# Patient Record
Sex: Male | Born: 1960 | Race: White | Hispanic: No | Marital: Single | Smoking: Current every day smoker
Health system: Southern US, Community
[De-identification: ages and names within clinical notes are randomized; demographics above are authoritative.]

## PROBLEM LIST (undated history)

## (undated) DIAGNOSIS — I10 Essential (primary) hypertension: Secondary | ICD-10-CM

## (undated) DIAGNOSIS — G4733 Obstructive sleep apnea (adult) (pediatric): Secondary | ICD-10-CM

## (undated) HISTORY — DX: Essential (primary) hypertension: I10

## (undated) HISTORY — DX: Obstructive sleep apnea (adult) (pediatric): G47.33

## (undated) HISTORY — PX: OTHER SURGICAL HISTORY: SHX169

---

## 2003-09-21 ENCOUNTER — Ambulatory Visit (HOSPITAL_COMMUNITY): Admission: RE | Admit: 2003-09-21 | Discharge: 2003-09-21 | Payer: Self-pay | Admitting: General Surgery

## 2003-11-16 ENCOUNTER — Inpatient Hospital Stay (HOSPITAL_COMMUNITY): Admission: EM | Admit: 2003-11-16 | Discharge: 2003-11-20 | Payer: Self-pay | Admitting: Emergency Medicine

## 2004-08-21 ENCOUNTER — Ambulatory Visit (HOSPITAL_COMMUNITY): Admission: RE | Admit: 2004-08-21 | Discharge: 2004-08-21 | Payer: Self-pay | Admitting: Endocrinology

## 2005-02-03 ENCOUNTER — Ambulatory Visit (HOSPITAL_COMMUNITY): Admission: RE | Admit: 2005-02-03 | Discharge: 2005-02-03 | Payer: Self-pay | Admitting: Orthopedic Surgery

## 2011-08-01 ENCOUNTER — Ambulatory Visit (INDEPENDENT_AMBULATORY_CARE_PROVIDER_SITE_OTHER): Payer: Managed Care, Other (non HMO) | Admitting: Internal Medicine

## 2011-08-01 VITALS — BP 153/83 | HR 78 | Temp 97.5°F | Resp 18 | Ht 70.0 in | Wt 335.0 lb

## 2011-08-01 DIAGNOSIS — F0781 Postconcussional syndrome: Secondary | ICD-10-CM

## 2011-08-01 DIAGNOSIS — Z6841 Body Mass Index (BMI) 40.0 and over, adult: Secondary | ICD-10-CM | POA: Insufficient documentation

## 2011-08-01 DIAGNOSIS — S0990XA Unspecified injury of head, initial encounter: Secondary | ICD-10-CM

## 2011-08-01 DIAGNOSIS — I10 Essential (primary) hypertension: Secondary | ICD-10-CM | POA: Insufficient documentation

## 2011-08-01 DIAGNOSIS — S0180XA Unspecified open wound of other part of head, initial encounter: Secondary | ICD-10-CM

## 2011-08-01 DIAGNOSIS — IMO0002 Reserved for concepts with insufficient information to code with codable children: Secondary | ICD-10-CM

## 2011-08-01 MED ORDER — TRAMADOL HCL 50 MG PO TABS: 50.0000 mg | ORAL_TABLET | Freq: Three times a day (TID) | ORAL | Status: AC | PRN

## 2011-08-01 MED ORDER — AMOXICILLIN-POT CLAVULANATE 875-125 MG PO TABS: ORAL_TABLET | ORAL | Status: DC

## 2011-08-01 MED ORDER — TETANUS-DIPHTH-ACELL PERTUSSIS 5-2.5-18.5 LF-MCG/0.5 IM SUSP
0.5000 mL | Freq: Once | INTRAMUSCULAR | Status: AC
  Administered 2011-08-01: 0.5 mL via INTRAMUSCULAR

## 2011-08-01 NOTE — Progress Notes (Signed)
  Subjective:    Patient ID: Colin Lam, male    DOB: Sep 13, 1960, 51 y.o.   MRN: [id number]  HPISlipped trying to get in and out of the rain and fell and hit doorframe R frontal wound 8pm last night No loss of consciousness Able to stabilize bleeding with pressure Mild headache No dizziness, no gait abnormality, no confusion, no sensory changes Mild decrease this morning in appetite  Last tetanus 7-8 years ago at least    Review of SystemsHypertension/on medications/cardiovascular review of systems negative otherwise     Objective:   Physical Exam  HENT:  Head: Head is with laceration. Head is without raccoon's eyes, without Battle's sign and without left periorbital erythema.     Blood pressure 153/83 Alert/in no acute distress/Oriented to time person and place Pupils equal round reactive to light and accommodation extra ocular movements conjugate Cranial nerves 2 through 12 intact        .umf Assessment & Plan:  Problem #1 head injury without loss of consciousness Problem #2 scalp laceration  Head injury precautions discussed Tdap Wound care Recheck for suture removal or sooner if worse Augmentin 875 twice a day for 10 days Tramadol if needed Recheck one week sooner if any signs of problems

## 2011-08-01 NOTE — Patient Instructions (Signed)
Wound Care Wound care helps prevent pain and infection.  You may need a tetanus shot if:  You cannot remember when you had your last tetanus shot.   You have never had a tetanus shot.   The injury broke your skin.  If you need a tetanus shot and you choose not to have one, you may get tetanus. Sickness from tetanus can be serious. HOME CARE   Only take medicine as told by your doctor.   Clean the wound daily with mild soap and water.   Change any bandages (dressings) as told by your doctor.   Put medicated cream and a bandage on the wound as told by your doctor.   Change the bandage if it gets wet, dirty, or starts to smell.   Take showers. Do not take baths, swim, or do anything that puts your wound under water.   Rest and raise (elevate) the wound until the pain and puffiness (swelling) are better.   Keep all doctor visits as told.  GET HELP RIGHT AWAY IF:   Yellowish-white fluid (pus) comes from the wound.   Medicine does not lessen your pain.   There is a red streak going away from the wound.   You cannot move your finger or toe.   You have a fever.  MAKE SURE YOU:   Understand these instructions.   Will watch your condition.   Will get help right away if you are not doing well or get worse.  Document Released: 11/19/2007 Document Revised: 01/29/2011 Document Reviewed: 06/15/2010 [facility] Patient Information 2012 [place], [place].Head Injury, Adult You have had a head injury that does not appear serious at this time. A concussion is a state of changed mental ability, usually from a blow to the head. You should take clear liquids for the rest of the day and then resume your regular diet. You should not take sedatives or alcoholic beverages for as long as directed by your caregiver after discharge. After injuries such as yours, most problems occur within the first 24 hours. SYMPTOMS These minor symptoms may be experienced after discharge:  Memory difficulties.    Dizziness.   Headaches.   Double vision.   Hearing difficulties.   Depression.   Tiredness.   Weakness.   Difficulty with concentration.  If you experience any of these problems, you should not be alarmed. A concussion requires a few days for recovery. Many patients with head injuries frequently experience such symptoms. Usually, these problems disappear without medical care. If symptoms last for more than one day, notify your caregiver. See your caregiver sooner if symptoms are becoming worse rather than better. HOME CARE INSTRUCTIONS   During the next 24 hours you must stay with someone who can watch you for the warning signs listed below.  Although it is unlikely that serious side effects will occur, you should be aware of signs and symptoms which may necessitate your return to this location. Side effects may occur up to 7 - 10 days following the injury. It is important for you to carefully monitor your condition and contact your caregiver or seek immediate medical attention if there is a change in your condition. SEEK IMMEDIATE MEDICAL CARE IF:   There is confusion or drowsiness.   You can not awaken the injured person.   There is nausea (feeling sick to your stomach) or continued, forceful vomiting.   You notice dizziness or unsteadiness which is getting worse, or inability to walk.   You have convulsions or  unconsciousness.   You experience severe, persistent headaches not relieved by over-the-counter or prescription medicines for pain. (Do not take aspirin as this impairs clotting abilities). Take other pain medications only as directed.   You can not use arms or legs normally.   There is clear or bloody discharge from the nose or ears.  MAKE SURE YOU:   Understand these instructions.   Will watch your condition.   Will get help right away if you are not doing well or get worse.  Document Released: 02/09/2005 Document Revised: 01/29/2011 Document Reviewed:  12/28/2008 [facility] Patient Information 2012 [place], [place].

## 2011-08-01 NOTE — Progress Notes (Signed)
   Patient ID: KAHLEL PEAKE MRN: [id number], DOB: [birth year], 51 y.o. Date of Encounter: 08/01/2011, 2:11 PM   PROCEDURE NOTE: Verbal consent obtained. Sterile technique employed. Numbing: Anesthesia obtained with 7cc 2% lidocaine with epi.  Cleansed with soap and water. Irrigated. Betadine prep per usual protocol.  Wound explored by myself and Dr. Merla Riches, revealing deep wound flap, and small galea defect.  Wound repaired with # 1 deep SI to repair small galea defect 5-0 Vicryl, #6 SI subcutaneous sutures 5-0 Vicryl, and #21 SI 5-0 Prolene. Hemostasis obtained. Wound cleansed and dressed.  Wound care instructions including precautions covered with patient. Handout given.  Anticipate suture removal in 7 days.  Signed, Eula Listen, PA-C 08/01/2011 2:11 PM

## 2011-08-08 ENCOUNTER — Ambulatory Visit (INDEPENDENT_AMBULATORY_CARE_PROVIDER_SITE_OTHER): Payer: Managed Care, Other (non HMO) | Admitting: Internal Medicine

## 2011-08-08 DIAGNOSIS — S0100XA Unspecified open wound of scalp, initial encounter: Secondary | ICD-10-CM

## 2011-08-09 NOTE — Progress Notes (Signed)
Followup for a recheck and suture removal after head injury He has done extremely well with no headaches or problems with work since his injury  Exam-the wound is well healed and the suture line shows no evidence of infection After removal of sutures there is no gapping or problems with healing  Impression #1 scalp laceration with head injury Both resolved/4 activity as tolerated

## 2014-07-06 ENCOUNTER — Encounter: Payer: Self-pay | Admitting: Endocrinology

## 2015-04-29 ENCOUNTER — Encounter: Payer: Self-pay | Admitting: Gastroenterology

## 2015-06-03 ENCOUNTER — Ambulatory Visit (AMBULATORY_SURGERY_CENTER): Payer: Self-pay

## 2015-06-03 VITALS — Ht 71.0 in | Wt 347.0 lb

## 2015-06-03 DIAGNOSIS — Z1211 Encounter for screening for malignant neoplasm of colon: Secondary | ICD-10-CM

## 2015-06-03 MED ORDER — SUPREP BOWEL PREP KIT 17.5-3.13-1.6 GM/177ML PO SOLN: 1.0000 | Freq: Once | ORAL | Status: DC

## 2015-06-03 NOTE — Progress Notes (Signed)
No allergies to eggs or soy No diet meds No home oxygen No past problems with anesthesia  No internet 

## 2015-06-17 ENCOUNTER — Ambulatory Visit (AMBULATORY_SURGERY_CENTER): Payer: 59 | Admitting: Gastroenterology

## 2015-06-17 ENCOUNTER — Encounter: Payer: Self-pay | Admitting: Gastroenterology

## 2015-06-17 VITALS — BP 129/84 | HR 62 | Temp 99.2°F | Resp 10 | Ht 71.0 in | Wt 347.0 lb

## 2015-06-17 DIAGNOSIS — D123 Benign neoplasm of transverse colon: Secondary | ICD-10-CM | POA: Diagnosis not present

## 2015-06-17 DIAGNOSIS — D122 Benign neoplasm of ascending colon: Secondary | ICD-10-CM

## 2015-06-17 DIAGNOSIS — Z1211 Encounter for screening for malignant neoplasm of colon: Secondary | ICD-10-CM

## 2015-06-17 MED ORDER — SODIUM CHLORIDE 0.9 % IV SOLN: 500.0000 mL | INTRAVENOUS | Status: DC

## 2015-06-17 NOTE — Patient Instructions (Signed)
YOU HAD AN ENDOSCOPIC PROCEDURE TODAY AT [facility]:   Refer to the procedure report that was given to you for any specific questions about what was found during the examination.  If the procedure report does not answer your questions, please call your gastroenterologist to clarify.  If you requested that your care partner not be given the details of your procedure findings, then the procedure report has been included in a sealed envelope for you to review at your convenience later.  YOU SHOULD EXPECT: Some feelings of bloating in the abdomen. Passage of more gas than usual.  Walking can help get rid of the air that was put into your GI tract during the procedure and reduce the bloating. If you had a lower endoscopy (such as a colonoscopy or flexible sigmoidoscopy) you may notice spotting of blood in your stool or on the toilet paper. If you underwent a bowel prep for your procedure, you may not have a normal bowel movement for a few days.  Please Note:  You might notice some irritation and congestion in your nose or some drainage.  This is from the oxygen used during your procedure.  There is no need for concern and it should clear up in a day or so.  SYMPTOMS TO REPORT IMMEDIATELY:   Following lower endoscopy (colonoscopy or flexible sigmoidoscopy):  Excessive amounts of blood in the stool  Significant tenderness or worsening of abdominal pains  Swelling of the abdomen that is new, acute  Fever of 100F or higher   For urgent or emergent issues, a gastroenterologist can be reached at any hour by calling [phone].   DIET: Your first meal following the procedure should be a small meal and then it is ok to progress to your normal diet. Heavy or fried foods are harder to digest and may make you feel nauseous or bloated.  Likewise, meals heavy in dairy and vegetables can increase bloating.  Drink plenty of fluids but you should avoid alcoholic beverages for 24  hours.  ACTIVITY:  You should plan to take it easy for the rest of today and you should NOT DRIVE or use heavy machinery until tomorrow (because of the sedation medicines used during the test).    FOLLOW UP: Our staff will call the number listed on your records the next business day following your procedure to check on you and address any questions or concerns that you may have regarding the information given to you following your procedure. If we do not reach you, we will leave a message.  However, if you are feeling well and you are not experiencing any problems, there is no need to return our call.  We will assume that you have returned to your regular daily activities without incident.  If any biopsies were taken you will be contacted by phone or by letter within the next 1-3 weeks.  Please call us at [phone] if you have not heard about the biopsies in 3 weeks.    SIGNATURES/CONFIDENTIALITY: You and/or your care partner have signed paperwork which will be entered into your electronic medical record.  These signatures attest to the fact that that the information above on your After Visit Summary has been reviewed and is understood.  Full responsibility of the confidentiality of this discharge information lies with you and/or your care-partner.  Read all of the handouts given to you by your recovery room nurse.  Thank-you for choosing [place] for your healthcare needs today.

## 2015-06-17 NOTE — Op Note (Signed)
[name] Procedure Date: 06/17/2015 1:52 PM MRN: [id number] Endoscopist: Ladene Artist , MD Age: 55 Date of Birth: 1960-12-12 Gender: Male Procedure:                Colonoscopy Indications:              Screening for colorectal malignant neoplasm Medicines:                Monitored Anesthesia Care Procedure:                Pre-Anesthesia Assessment:                           - Prior to the procedure, a History and Physical                            was performed, and patient medications and                            allergies were reviewed. The patient's tolerance of                            previous anesthesia was also reviewed. The risks                            and benefits of the procedure and the sedation                            options and risks were discussed with the patient.                            All questions were answered, and informed consent                            was obtained. Prior Anticoagulants: The patient has                            taken no previous anticoagulant or antiplatelet                            agents. ASA Grade Assessment: II - A patient with                            mild systemic disease. After reviewing the risks                            and benefits, the patient was deemed in                            satisfactory condition to undergo the procedure.                           After obtaining informed consent, the colonoscope  was passed under direct vision. Throughout the                            procedure, the patient's blood pressure, pulse, and                            oxygen saturations were monitored continuously. The                            Model PCF-H190L [id number]) scope was introduced                            through the anus and advanced to the the cecum,                            identified by appendiceal orifice and ileocecal                    valve. The colonoscopy was performed without                            difficulty. The patient tolerated the procedure                            well. The quality of the bowel preparation was                            adequate. The ileocecal valve, appendiceal orifice,                            and rectum were photographed. Scope In: 2:02:17 PM Scope Out: 2:19:56 PM Scope Withdrawal Time: 0 hours 15 minutes 18 seconds  Total Procedure Duration: 0 hours 17 minutes 39 seconds  Findings:                 The digital rectal exam was normal.                           Three sessile polyps were found in the transverse                            colon (1) and ascending colon (2). The polyps were                            7 to 8 mm in size. These polyps were removed with a                            cold snare. Resection and retrieval were complete.                           Internal hemorrhoids were found during                            retroflexion. The hemorrhoids were small and Grade  I (internal hemorrhoids that do not prolapse).                           The exam was otherwise normal throughout the                            examined colon.                           No additional abnormalities were found on                            retroflexion. Complications:            No immediate complications. Estimated Blood Loss:     Estimated blood loss: none. Impression:               - Three 7 to 8 mm polyps in the transverse colon                            and in the ascending colon, removed with a cold                            snare. Resected and retrieved.                           - Internal hemorrhoids. Recommendation:           - Patient has a contact number available for                            emergencies. The signs and symptoms of potential                            delayed complications were discussed with the                             patient. Return to normal activities tomorrow.                            Written discharge instructions were provided to the                            patient.                           - Resume previous diet.                           - Continue present medications.                           - Await pathology results.                           - Repeat colonoscopy in 5 years for surveillance if  polyp(s) are precancerous, otherwise 10 years. Ladene Artist, MD 06/17/2015 2:23:50 PM This report has been signed electronically.

## 2015-06-17 NOTE — Progress Notes (Signed)
Report given to PACU RN, vss 

## 2015-06-17 NOTE — Progress Notes (Signed)
Called to room to assist during endoscopic procedure.  Patient ID and intended procedure confirmed with present staff. Received instructions for my participation in the procedure from the performing physician.  

## 2015-06-18 ENCOUNTER — Telehealth: Payer: Self-pay | Admitting: *Deleted

## 2015-06-18 NOTE — Telephone Encounter (Signed)
  Follow up Call-  Call back number 06/17/2015  Post procedure Call Back phone  # [phone]  Permission to leave phone message Yes     Patient questions:  Do you have a fever, pain , or abdominal swelling? No. Pain Score  0 *  Have you tolerated food without any problems? Yes.    Have you been able to return to your normal activities? Yes.    Do you have any questions about your discharge instructions: Diet   No. Medications  No. Follow up visit  No.  Do you have questions or concerns about your Care? No.  Actions: * If pain score is 4 or above: No action needed, pain <4.

## 2015-06-28 ENCOUNTER — Encounter: Payer: Self-pay | Admitting: Gastroenterology

## 2017-09-09 ENCOUNTER — Other Ambulatory Visit: Payer: Self-pay | Admitting: Rheumatology

## 2017-09-09 DIAGNOSIS — M25562 Pain in left knee: Secondary | ICD-10-CM

## 2017-09-12 ENCOUNTER — Ambulatory Visit
Admission: RE | Admit: 2017-09-12 | Discharge: 2017-09-12 | Disposition: A | Discharge status: Home / Self Care | Payer: 59 | Source: Ambulatory Visit | Attending: Rheumatology | Admitting: Rheumatology

## 2017-09-12 DIAGNOSIS — M25562 Pain in left knee: Secondary | ICD-10-CM

## 2019-12-20 IMAGING — MR MR KNEE*L* W/O CM
4 of 7 series · 23 of 40 positions shown · non-contrast
Comparison: Radiographs 06/24/2017

CLINICAL DATA: Knee pain and weakness for 3-4 months.

EXAM:
MRI OF THE LEFT KNEE WITHOUT CONTRAST
TECHNIQUE: Multiplanar, multisequence MR imaging of the knee was performed. No
intravenous contrast was administered.

[Series 5: T2 fat-sat · coronal · 4.0mm · 0.68mm/px · 6 of 33 slices shown (1 of 2)]
[im 1/33]
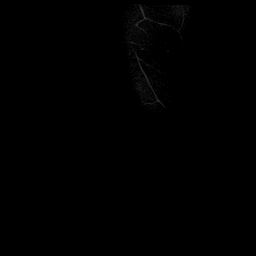
[im 7/33]
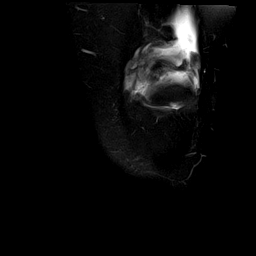
[im 13/33]
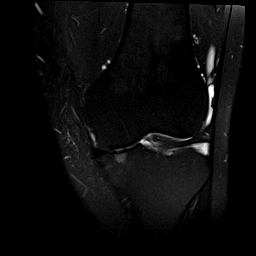
[im 20/33]
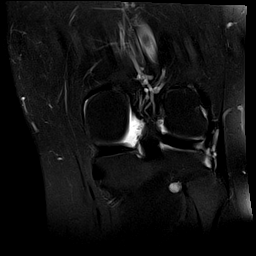
[im 26/33]
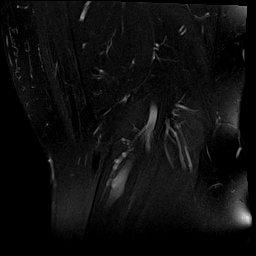
[im 33/33]
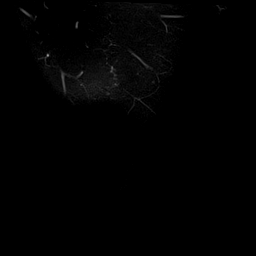

[Series 6: T1 · coronal · 4.0mm · 0.34mm/px · 5 of 33 slices shown]
[im 1/33]
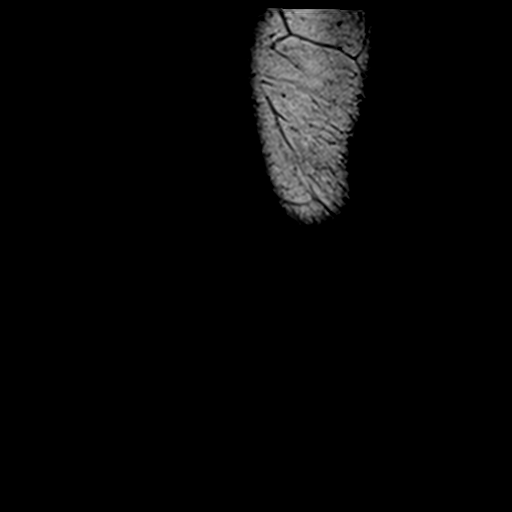
[im 7/33]
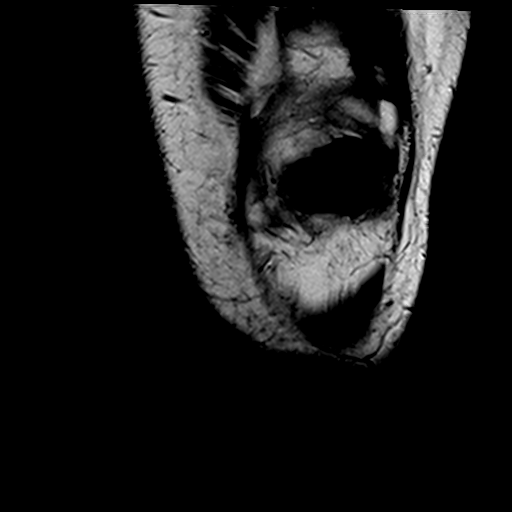
[im 13/33]
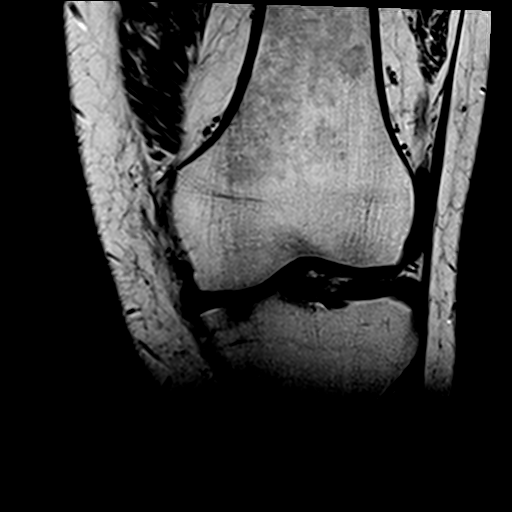
[im 20/33]
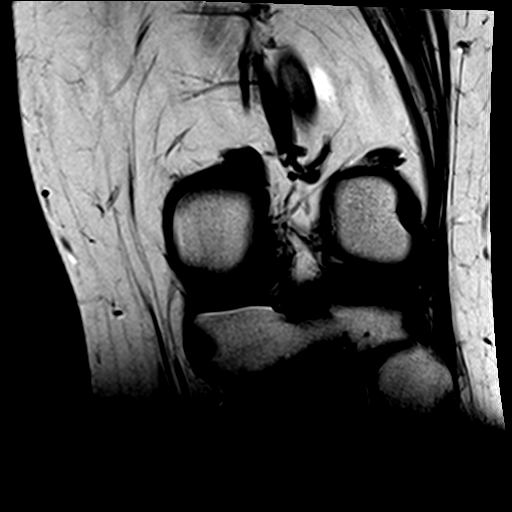
[im 33/33]
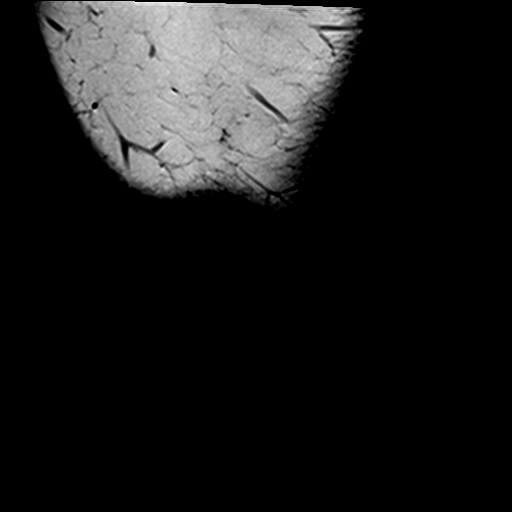

[Series 8: PD fat-sat · sagittal · 3.0mm · 0.34mm/px · 6 of 33 slices shown]
[im 1/33]
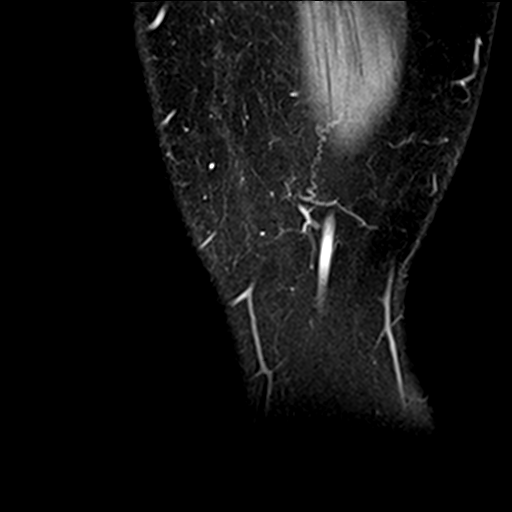
[im 7/33]
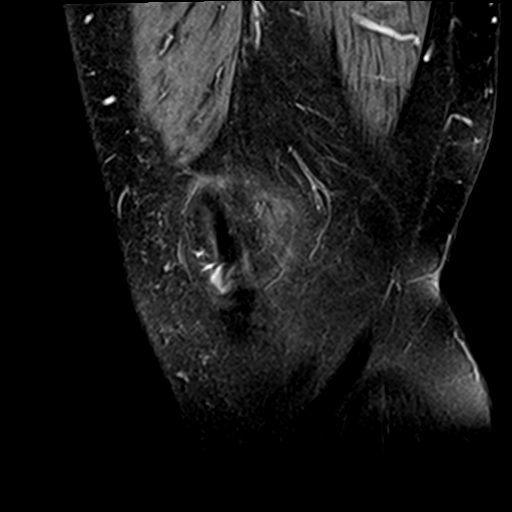
[im 13/33]
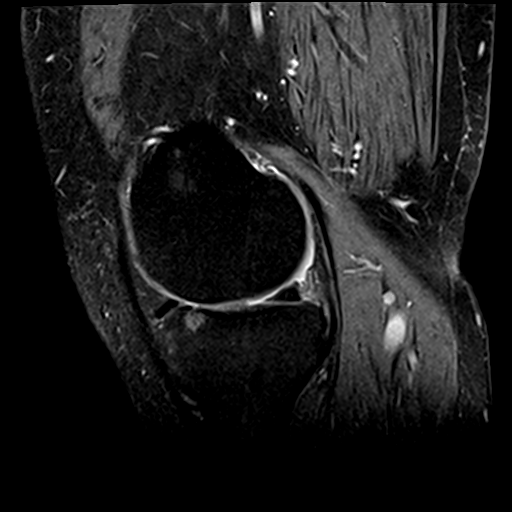
[im 20/33]
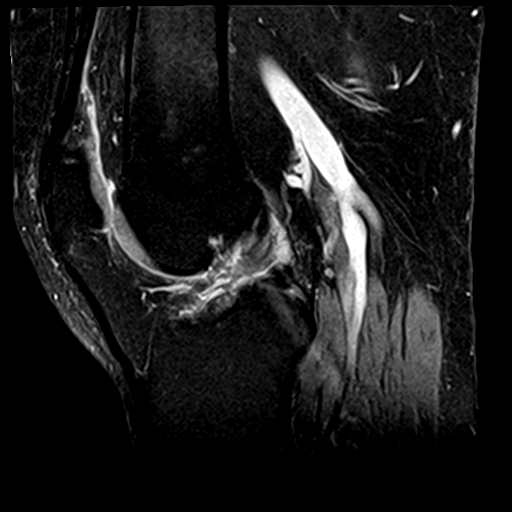
[im 26/33]
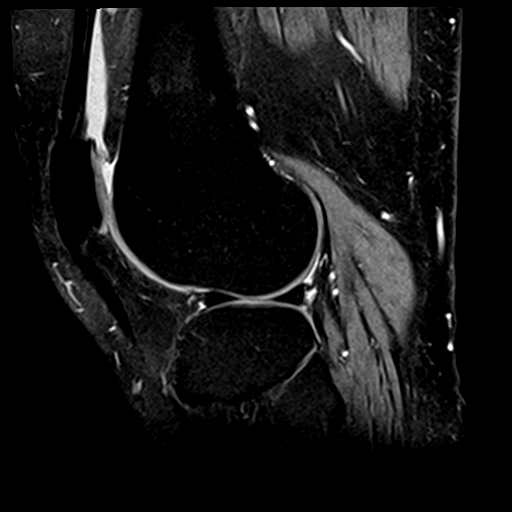
[im 33/33]
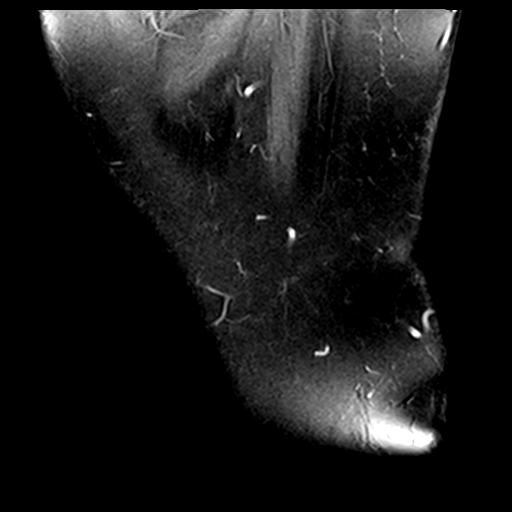

[Series 9: T2 fat-sat · sagittal · 3.0mm · 0.34mm/px · 6 of 33 slices shown (2 of 2)]
[im 1/33]
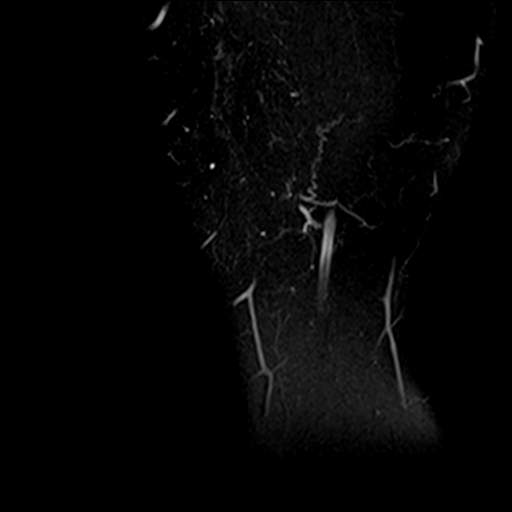
[im 7/33]
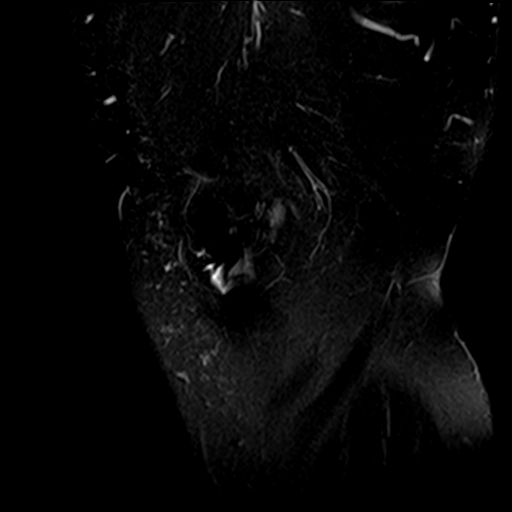
[im 13/33]
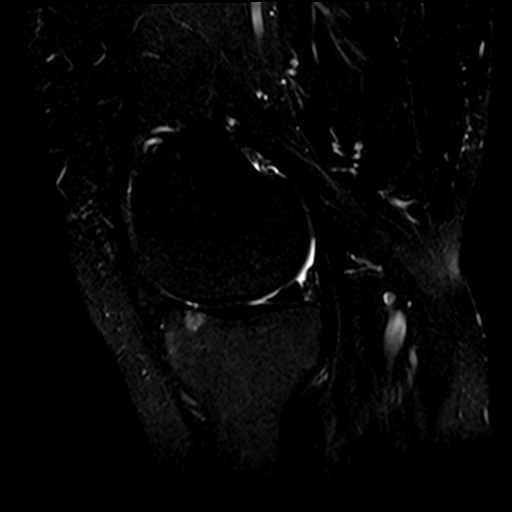
[im 20/33]
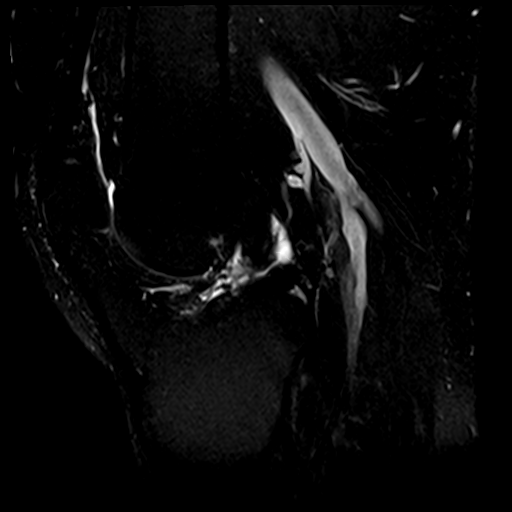
[im 26/33]
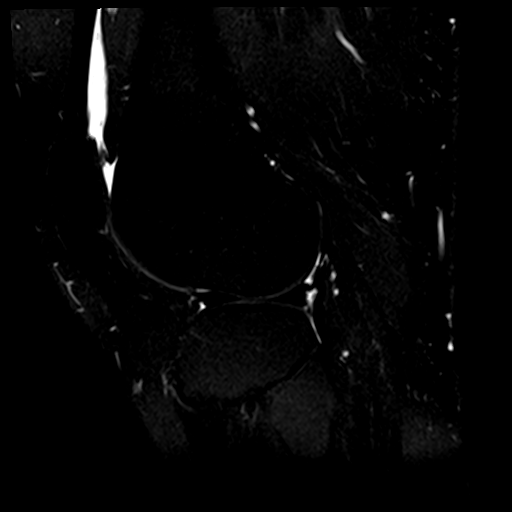
[im 33/33]
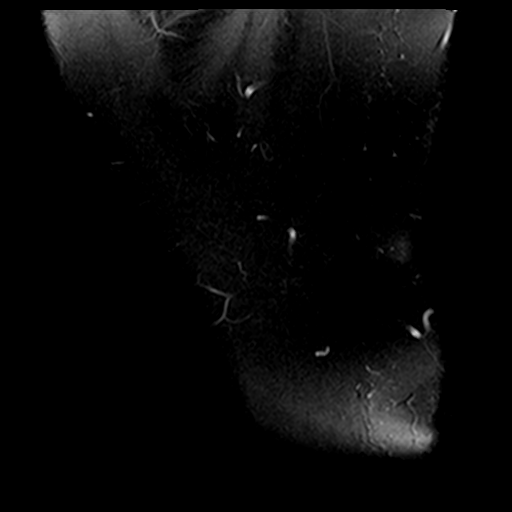

[23 of 40 positions shown; findings below may reference images not displayed]

FINDINGS: MENISCI

Medial meniscus:  Intact

Lateral meniscus:  Intact

LIGAMENTS

Cruciates:  Intact

Collaterals:  Intact

CARTILAGE

Patellofemoral: Focal chondromalacia noted at the patellar apex with
moderate cartilage thinning, fissuring and fraying and underlying
subchondral edema. Elongated and very flat lateral facet and very
shallow femoral trochlea.

Medial:  Mild degenerative chondrosis.

Lateral:  Mild degenerative chondrosis.

Joint:  Small joint effusion.

Popliteal Fossa:  No popliteal mass or Baker's cyst.

Extensor Mechanism: The patella retinacular structures are intact
and the quadriceps and patellar tendons are intact. There is mild
proximal and distal patellar tendinopathy.

Bones:  No acute bony findings.

Other: Normal knee musculature.
IMPRESSION: 1. Focal chondromalacia at the patellar apex.
2. Proximal and distal patellar tendinopathy.
3. Intact ligamentous structures and no acute bony findings.
4. No meniscal tears.
5. Small joint effusion.

## 2022-07-29 ENCOUNTER — Other Ambulatory Visit: Payer: Self-pay

## 2022-07-29 DIAGNOSIS — F17200 Nicotine dependence, unspecified, uncomplicated: Secondary | ICD-10-CM

## 2022-08-14 ENCOUNTER — Encounter: Payer: Self-pay | Admitting: Rheumatology

## 2022-08-31 ENCOUNTER — Ambulatory Visit
Admission: RE | Admit: 2022-08-31 | Discharge: 2022-08-31 | Disposition: A | Discharge status: Home / Self Care | Payer: 59 | Source: Ambulatory Visit

## 2022-08-31 DIAGNOSIS — F17200 Nicotine dependence, unspecified, uncomplicated: Secondary | ICD-10-CM

## 2023-01-17 ENCOUNTER — Other Ambulatory Visit: Payer: Self-pay

## 2023-01-17 ENCOUNTER — Emergency Department (HOSPITAL_COMMUNITY)
Admission: EM | Admit: 2023-01-17 | Discharge: 2023-01-17 | Disposition: A | Discharge status: Home / Self Care | Payer: 59 | Attending: Emergency Medicine | Admitting: Emergency Medicine

## 2023-01-17 ENCOUNTER — Encounter (HOSPITAL_COMMUNITY): Payer: Self-pay | Admitting: *Deleted

## 2023-01-17 ENCOUNTER — Emergency Department (HOSPITAL_COMMUNITY): Payer: 59

## 2023-01-17 DIAGNOSIS — I10 Essential (primary) hypertension: Secondary | ICD-10-CM

## 2023-01-17 DIAGNOSIS — G4700 Insomnia, unspecified: Secondary | ICD-10-CM | POA: Diagnosis not present

## 2023-01-17 DIAGNOSIS — R110 Nausea: Secondary | ICD-10-CM | POA: Diagnosis present

## 2023-01-17 DIAGNOSIS — Z79899 Other long term (current) drug therapy: Secondary | ICD-10-CM | POA: Insufficient documentation

## 2023-01-17 DIAGNOSIS — F5102 Adjustment insomnia: Secondary | ICD-10-CM

## 2023-01-17 DIAGNOSIS — F419 Anxiety disorder, unspecified: Secondary | ICD-10-CM | POA: Diagnosis not present

## 2023-01-17 LAB — CBC
HCT: 46.3 % (ref 39.0–52.0)
Hemoglobin: 15.7 g/dL (ref 13.0–17.0)
MCH: 31.1 pg (ref 26.0–34.0)
MCHC: 33.9 g/dL (ref 30.0–36.0)
MCV: 91.7 fL (ref 80.0–100.0)
Platelets: 156 10*3/uL (ref 150–400)
RBC: 5.05 MIL/uL (ref 4.22–5.81)
RDW: 12.9 % (ref 11.5–15.5)
WBC: 6.8 10*3/uL (ref 4.0–10.5)
nRBC: 0 % (ref 0.0–0.2)

## 2023-01-17 LAB — BASIC METABOLIC PANEL
Anion gap: 12 (ref 5–15)
BUN: 9 mg/dL (ref 8–23)
CO2: 26 mmol/L (ref 22–32)
Calcium: 9 mg/dL (ref 8.9–10.3)
Chloride: 95 mmol/L — ABNORMAL LOW (ref 98–111)
Creatinine, Ser: 1.2 mg/dL (ref 0.61–1.24)
GFR, Estimated: >60 mL/min (ref >60)
Glucose, Bld: 128 mg/dL — ABNORMAL HIGH (ref 70–99)
Potassium: 3.3 mmol/L — ABNORMAL LOW (ref 3.5–5.1)
Sodium: 133 mmol/L — ABNORMAL LOW (ref 135–145)

## 2023-01-17 LAB — TROPONIN I (HIGH SENSITIVITY): Troponin I (High Sensitivity): 4 ng/L (ref <18)

## 2023-01-17 MED ORDER — LORAZEPAM 1 MG PO TABS: 1.0000 mg | ORAL_TABLET | Freq: Every evening | ORAL | 0 refills | Status: AC | PRN

## 2023-01-17 NOTE — ED Provider Notes (Signed)
[place] [facility] EMERGENCY DEPARTMENT AT [facility] Provider Note   CSN: [id number] Arrival date & time: 01/17/23  0442     History  Chief Complaint  Patient presents with   Hypertension   Insomnia    Colin Lam is a 62 y.o. male.  The history is provided by the patient.  Patient with history of obesity and hypertension presents for multiple complaints  Patient reports over the past week he has had viral type symptoms including chills, nausea and diarrhea.  He called his PCP for evaluation but they told him he had to test for COVID first before going to the doctor  Patient also reports he is under tremendous stress.  He reports he recently lost his job, his mother had a recent fall and his older brother just passed away from colorectal cancer He reports over the past several days he has had difficulty sleeping as when he tries to lie flat he becomes nauseated.  He has had no chest pain but reports nausea and shortness of breath.  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  He reports he is currently on BP meds   Past Medical History:  Diagnosis Date   Hypertension    OSA (obstructive sleep apnea)     Home Medications Prior to Admission medications   Medication Sig Start Date End Date Taking? Authorizing Provider  LORazepam (ATIVAN) 1 MG tablet Take 1 tablet (1 mg total) by mouth at bedtime as needed for anxiety. 01/17/23  Yes Zadie Rhine, MD  amLODipine (NORVASC) 10 MG tablet Take 10 mg by mouth daily.    [provider]  benazepril (LOTENSIN) 10 MG tablet Take 10 mg by mouth daily.    [provider]      Allergies    Ceclor [cefaclor]    Review of Systems   Review of Systems  Constitutional:  Negative for fever.  Gastrointestinal:  Positive for nausea. Negative for vomiting.  Psychiatric/Behavioral:   Negative for suicidal ideas.     Physical Exam Updated Vital Signs BP 138/77   Pulse 62   Temp (!) 97.5 F (36.4 C) (Oral)   Resp 13   Ht 1.778 m (5\' 10" )   Wt (!) 145.2 kg   SpO2 93%   BMI 45.92 kg/m  Physical Exam CONSTITUTIONAL: Well developed/well nourished HEAD: Normocephalic/atraumatic EYES: EOMI/PERRL ENMT: Mucous membranes moist NECK: supple no meningeal signs CV: S1/S2 noted, no murmurs/rubs/gallops noted LUNGS: Lungs are clear to auscultation bilaterally, no apparent distress ABDOMEN: soft, nontender NEURO: Pt is awake/alert/appropriate, moves all extremitiesx4.  No facial droop.   EXTREMITIES: pulses normal/equalx4, full ROM SKIN: warm, color normal PSYCH: Flat affect  ED Results / Procedures / Treatments   Labs (all labs ordered are listed, but only abnormal results are displayed) Labs Reviewed  BASIC METABOLIC PANEL - Abnormal; Notable for the following components:      Result Value   Sodium 133 (*)    Potassium 3.3 (*)    Chloride 95 (*)    Glucose, Bld 128 (*)    All other components within normal limits  CBC  TROPONIN I (HIGH SENSITIVITY)    EKG EKG Interpretation Date/Time:  Sunday January 17 2023 04:26:41 EST Ventricular Rate:  78 PR Interval:  154 QRS Duration:  98 QT Interval:  410 QTC Calculation: 467 R Axis:   39  Text Interpretation: Normal  sinus rhythm Normal ECG Interpretation limited secondary to artifact Confirmed by Zadie Rhine ([id number]) on 01/17/2023 5:12:08 AM  Radiology DG Chest 2 View  Result Date: 01/17/2023 CLINICAL DATA:  Hypertension. EXAM: CHEST - 2 VIEW COMPARISON:  CT chest 08/31/2022 FINDINGS: Heart size and mediastinal contours are normal. No pleural fluid or interstitial edema. Scar versus platelike atelectasis in the left lower lung. New from previous exam. No airspace consolidation or signs of pneumothorax. IMPRESSION: Scar versus platelike atelectasis in the left lower lung. Electronically Signed   By: Signa Kell M.D.   On: 01/17/2023 05:51    Procedures Procedures    Medications Ordered in ED Medications - No data to display  ED Course/ Medical Decision Making/ A&P Clinical Course as of 01/17/23 6045  Wynelle Link Jan 17, 2023  0608 Increase anxiety due to multiple stressors including job loss and the loss of his brother.  He denies any SI.  Reports he has gotten very little sleep which is an expected reaction to all of the stressors.  Overall workup is reassuring.  Patient is already on medicines for hypertension [DW]  [phone] Patient reports feeling much improved.  He is awake and alert in no acute distress.  Overall labs are reassuring.  Patient feels comfortable for discharge home.  He will be given outpatient resources.  He also be given a short course of medications for anxiety given his acute stress reaction due to multiple stressors.  [DW]    Clinical Course User Index [DW] Zadie Rhine, MD                                 Medical Decision Making Amount and/or Complexity of Data Reviewed Labs: ordered. Radiology: ordered.  Risk Prescription drug management.  This patient presents to the ED for concern of elevated blood pressure, nausea, this involves an extensive number of treatment options, and is a complaint that carries with it a high risk of complications and morbidity.  The differential diagnosis includes but is not limited to asymptomatic hypertension, hypertensive emergency, acute coronary syndrome, aortic dissection, CVA  Comorbidities that complicate the patient evaluation: Patient's presentation is complicated by their history of obesity and hypertension  Social Determinants of Health: Patient's  recent loss of job and family member   increases the complexity of managing their presentation  Additional history obtained: Records reviewed  outpatient records reviewed  Lab Tests: I Ordered, and personally interpreted labs.  The pertinent results include: Labs overall  unremarkable  Imaging Studies ordered: I ordered imaging studies including X-ray chest   I independently visualized and interpreted imaging which showed no acute findings I agree with the radiologist interpretation   Reevaluation: After the interventions noted above, I reevaluated the patient and found that they have :improved  Complexity of problems addressed: Patient's presentation is most consistent with  acute presentation with potential threat to life or bodily function  Disposition: After consideration of the diagnostic results and the patient's response to treatment,  I feel that the patent would benefit from discharge   .           Final Clinical Impression(s) / ED Diagnoses Final diagnoses:  Anxiety  Adjustment insomnia  Primary hypertension    Rx / DC Orders ED Discharge Orders          Ordered    LORazepam (ATIVAN) 1 MG tablet  At bedtime PRN        01/17/23  1610              Zadie Rhine, MD 01/17/23 [phone]

## 2023-01-17 NOTE — Discharge Instructions (Signed)

## 2023-01-17 NOTE — ED Triage Notes (Signed)
Patient presents to ED c/o not being able to sleep and his b/p being elevated. States this has been going on x 1 week. States  he went to his local [facility] on fri and had his b/p taken and it was elevated. Also states everytime his lays down he feels nauseated and he has insomnia. Denies pain

## 2023-01-17 NOTE — ED Notes (Signed)
AVS provided by edp was discussed with pt. Pt verbalized understanding with no additional questions at this time.

## 2023-10-26 DIAGNOSIS — R7303 Prediabetes: Secondary | ICD-10-CM | POA: Diagnosis not present

## 2023-11-05 DIAGNOSIS — F5101 Primary insomnia: Secondary | ICD-10-CM | POA: Diagnosis not present

## 2023-11-05 DIAGNOSIS — E785 Hyperlipidemia, unspecified: Secondary | ICD-10-CM | POA: Diagnosis not present

## 2023-11-05 DIAGNOSIS — Z720 Tobacco use: Secondary | ICD-10-CM | POA: Diagnosis not present

## 2023-11-05 DIAGNOSIS — J449 Chronic obstructive pulmonary disease, unspecified: Secondary | ICD-10-CM | POA: Diagnosis not present

## 2023-11-05 DIAGNOSIS — R7303 Prediabetes: Secondary | ICD-10-CM | POA: Diagnosis not present

## 2023-11-05 DIAGNOSIS — I10 Essential (primary) hypertension: Secondary | ICD-10-CM | POA: Diagnosis not present

## 2023-11-05 DIAGNOSIS — I700 Atherosclerosis of aorta: Secondary | ICD-10-CM | POA: Diagnosis not present

## 2023-11-05 DIAGNOSIS — Z23 Encounter for immunization: Secondary | ICD-10-CM | POA: Diagnosis not present
